# Patient Record
Sex: Male | Born: 1980 | Race: Black or African American | Hispanic: No | Marital: Single | Smoking: Current some day smoker
Health system: Southern US, Community
[De-identification: ages and names within clinical notes are randomized; demographics above are authoritative.]

## PROBLEM LIST (undated history)

## (undated) HISTORY — PX: OTHER SURGICAL HISTORY: SHX169

## (undated) HISTORY — PX: ABDOMINAL SURGERY: SHX537

---

## 2019-02-14 ENCOUNTER — Emergency Department (HOSPITAL_COMMUNITY)
Admission: EM | Admit: 2019-02-14 | Discharge: 2019-02-14 | Disposition: A | Discharge status: Home / Self Care | Payer: BLUE CROSS/BLUE SHIELD | Attending: Emergency Medicine | Admitting: Emergency Medicine

## 2019-02-14 ENCOUNTER — Other Ambulatory Visit: Payer: Self-pay

## 2019-02-14 ENCOUNTER — Emergency Department (HOSPITAL_COMMUNITY): Payer: BLUE CROSS/BLUE SHIELD

## 2019-02-14 ENCOUNTER — Encounter (HOSPITAL_COMMUNITY): Payer: Self-pay | Admitting: Emergency Medicine

## 2019-02-14 DIAGNOSIS — Y999 Unspecified external cause status: Secondary | ICD-10-CM | POA: Insufficient documentation

## 2019-02-14 DIAGNOSIS — S59901A Unspecified injury of right elbow, initial encounter: Secondary | ICD-10-CM | POA: Diagnosis present

## 2019-02-14 DIAGNOSIS — F1290 Cannabis use, unspecified, uncomplicated: Secondary | ICD-10-CM | POA: Insufficient documentation

## 2019-02-14 DIAGNOSIS — F17210 Nicotine dependence, cigarettes, uncomplicated: Secondary | ICD-10-CM | POA: Diagnosis not present

## 2019-02-14 DIAGNOSIS — Z20828 Contact with and (suspected) exposure to other viral communicable diseases: Secondary | ICD-10-CM | POA: Diagnosis not present

## 2019-02-14 DIAGNOSIS — X500XXA Overexertion from strenuous movement or load, initial encounter: Secondary | ICD-10-CM | POA: Insufficient documentation

## 2019-02-14 DIAGNOSIS — Y92831 Amusement park as the place of occurrence of the external cause: Secondary | ICD-10-CM | POA: Insufficient documentation

## 2019-02-14 DIAGNOSIS — S42451A Displaced fracture of lateral condyle of right humerus, initial encounter for closed fracture: Secondary | ICD-10-CM | POA: Diagnosis not present

## 2019-02-14 DIAGNOSIS — Y9389 Activity, other specified: Secondary | ICD-10-CM | POA: Insufficient documentation

## 2019-02-14 LAB — CBC
HCT: 41.5 % (ref 39.0–52.0)
Hemoglobin: 13.2 g/dL (ref 13.0–17.0)
MCH: 30.6 pg (ref 26.0–34.0)
MCHC: 31.8 g/dL (ref 30.0–36.0)
MCV: 96.1 fL (ref 80.0–100.0)
Platelets: 239 10*3/uL (ref 150–400)
RBC: 4.32 MIL/uL (ref 4.22–5.81)
RDW: 14.9 % (ref 11.5–15.5)
WBC: 10.3 10*3/uL (ref 4.0–10.5)
nRBC: 0 % (ref 0.0–0.2)

## 2019-02-14 LAB — BASIC METABOLIC PANEL
Anion gap: 15 (ref 5–15)
BUN: 9 mg/dL (ref 6–20)
CO2: 20 mmol/L — ABNORMAL LOW (ref 22–32)
Calcium: 9.3 mg/dL (ref 8.9–10.3)
Chloride: 102 mmol/L (ref 98–111)
Creatinine, Ser: 1 mg/dL (ref 0.61–1.24)
GFR calc Af Amer: >60 mL/min (ref >60)
GFR calc non Af Amer: >60 mL/min (ref >60)
Glucose, Bld: 75 mg/dL (ref 70–99)
Potassium: 3.9 mmol/L (ref 3.5–5.1)
Sodium: 137 mmol/L (ref 135–145)

## 2019-02-14 LAB — SARS CORONAVIRUS 2 BY RT PCR (HOSPITAL ORDER, PERFORMED IN ~~LOC~~ HOSPITAL LAB): SARS Coronavirus 2: NEGATIVE

## 2019-02-14 MED ORDER — OXYCODONE-ACETAMINOPHEN 5-325 MG PO TABS: 1.0000 | ORAL_TABLET | Freq: Four times a day (QID) | ORAL | 0 refills | Status: AC | PRN

## 2019-02-14 MED ORDER — MORPHINE SULFATE (PF) 4 MG/ML IV SOLN
4.0000 mg | Freq: Once | INTRAVENOUS | Status: AC
  Administered 2019-02-14: 4 mg via INTRAVENOUS
  Filled 2019-02-14: qty 1

## 2019-02-14 MED ORDER — OXYCODONE-ACETAMINOPHEN 5-325 MG PO TABS
1.0000 | ORAL_TABLET | ORAL | Status: DC | PRN
  Administered 2019-02-14: 1 via ORAL
  Filled 2019-02-14: qty 1

## 2019-02-14 NOTE — Discharge Instructions (Addendum)
1. Medications: Oxycodone, ibuprofen, usual home medications 2. Treatment: rest, ice, elevate; keep cast clean and dry, drink plenty of fluids, gentle stretching 3. Follow Up: Please followup with orthopedics as directed; Please return to the ER for worsening symptoms help with your cast or other concerns

## 2019-02-14 NOTE — ED Notes (Signed)
Pt refused blood draw.  Nurse notified 

## 2019-02-14 NOTE — ED Notes (Signed)
Patient transported to X-ray 

## 2019-02-14 NOTE — Progress Notes (Signed)
Orthopedic Tech Progress Note Patient Details:  Levi Lloyd [birth year] [id number]  Ortho Devices Type of Ortho Device: Post (long arm) splint Ortho Device/Splint Interventions: Application, Ordered   Post Interventions Patient Tolerated: Well Instructions Provided: Care of device, Adjustment of device   Melony Overly T 02/14/2019, 6:22 AM

## 2019-02-14 NOTE — ED Notes (Signed)
Ice pack given  Past humerus injury  Rt arm

## 2019-02-14 NOTE — ED Notes (Signed)
Ortho tech  Has been called to place a splint
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
## 2019-02-14 NOTE — ED Notes (Signed)
Pt is able to call his friends or family

## 2019-02-14 NOTE — ED Triage Notes (Signed)
Report he injured his R arm on a slip and slide. Pt has swelling to R elbow. Pt reports pain to R arm.

## 2019-02-14 NOTE — ED Notes (Addendum)
The pt is c/o pain and  Swelling to the  Elbow  The pt fell on a slip and slide 6 hours ago  C/o pain rt elbow and humerus

## 2020-09-19 IMAGING — DX RIGHT HUMERUS - 2+ VIEW
4 series · 4 of 4 positions shown · non-contrast
Comparison: None.

CLINICAL DATA: 38-year-old male with right upper extremity pain
after fall.

EXAM:
RIGHT HUMERUS - 2+ VIEW; RIGHT FOREARM - 2 VIEW

[humerus ap (1 of 2)]
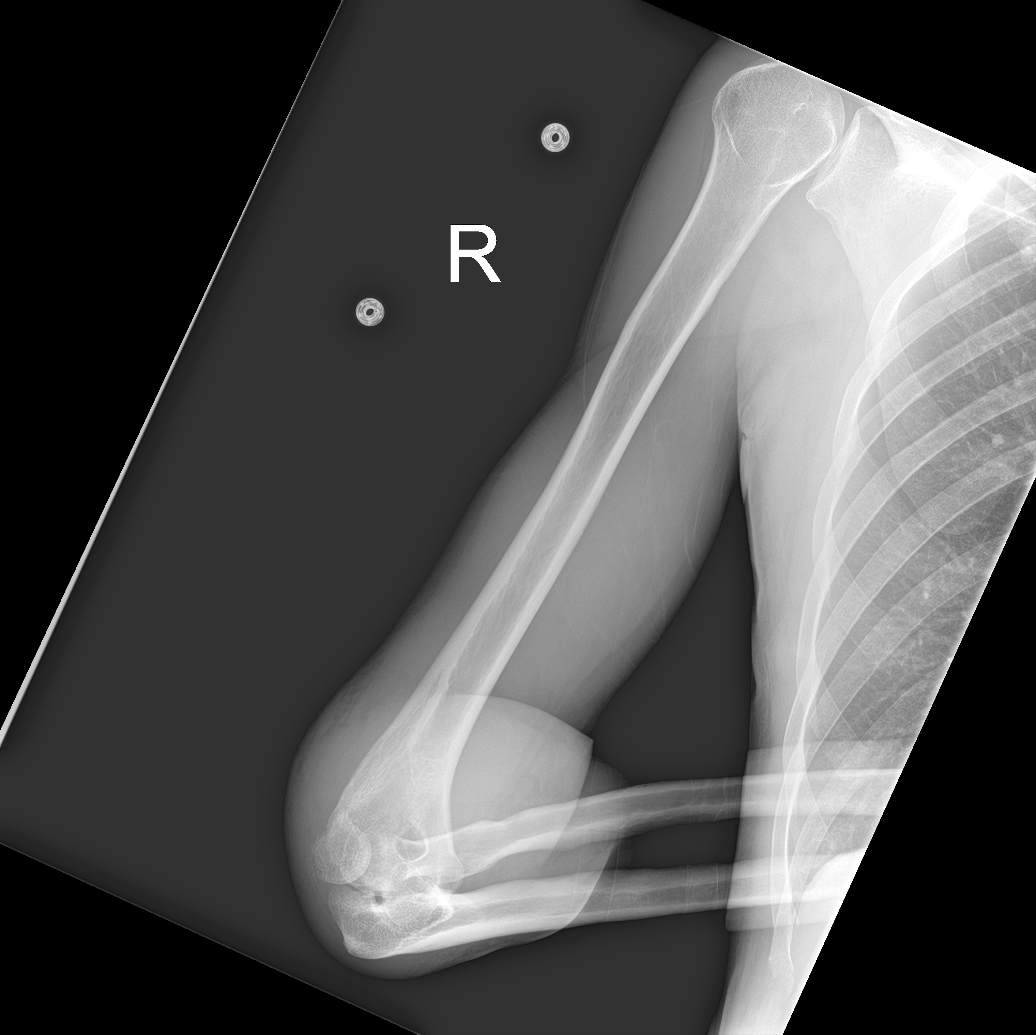

[humerus lat (1 of 2)]
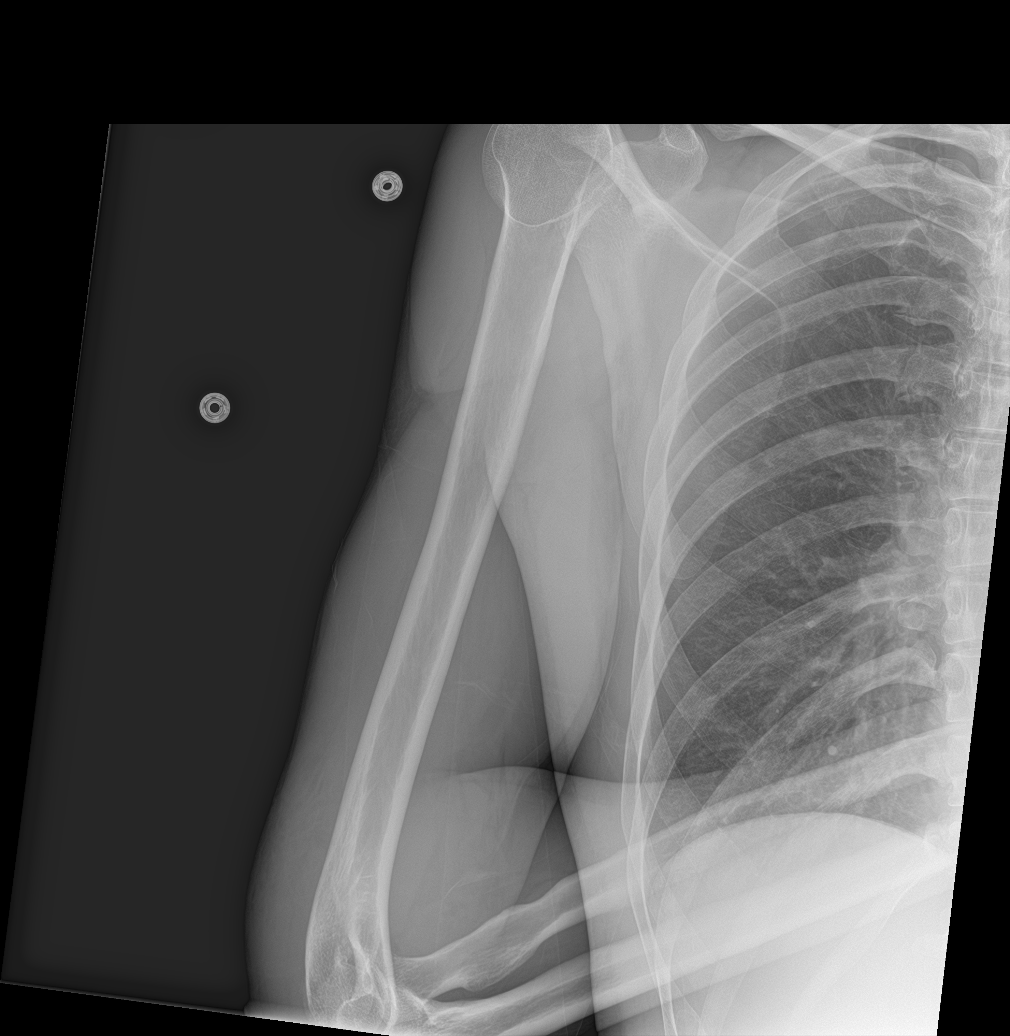

[humerus lat (2 of 2)]
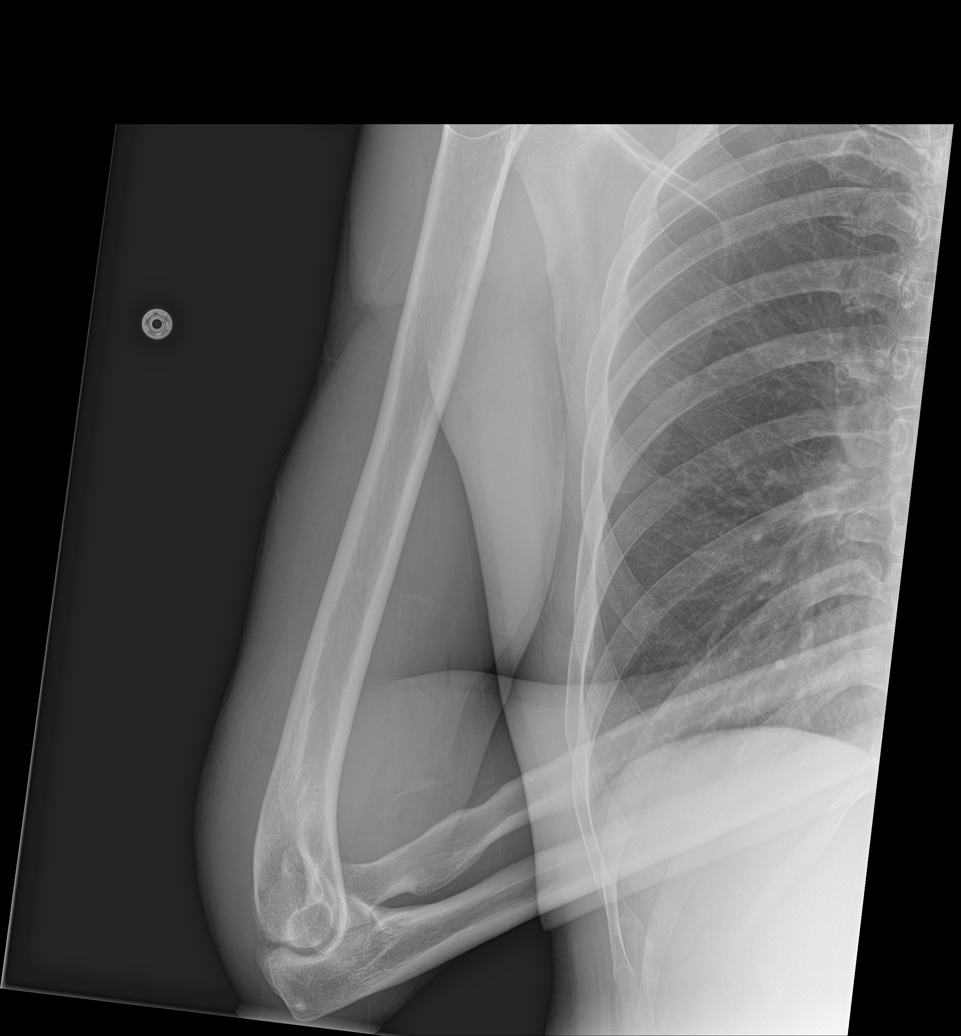

[humerus ap (2 of 2)]
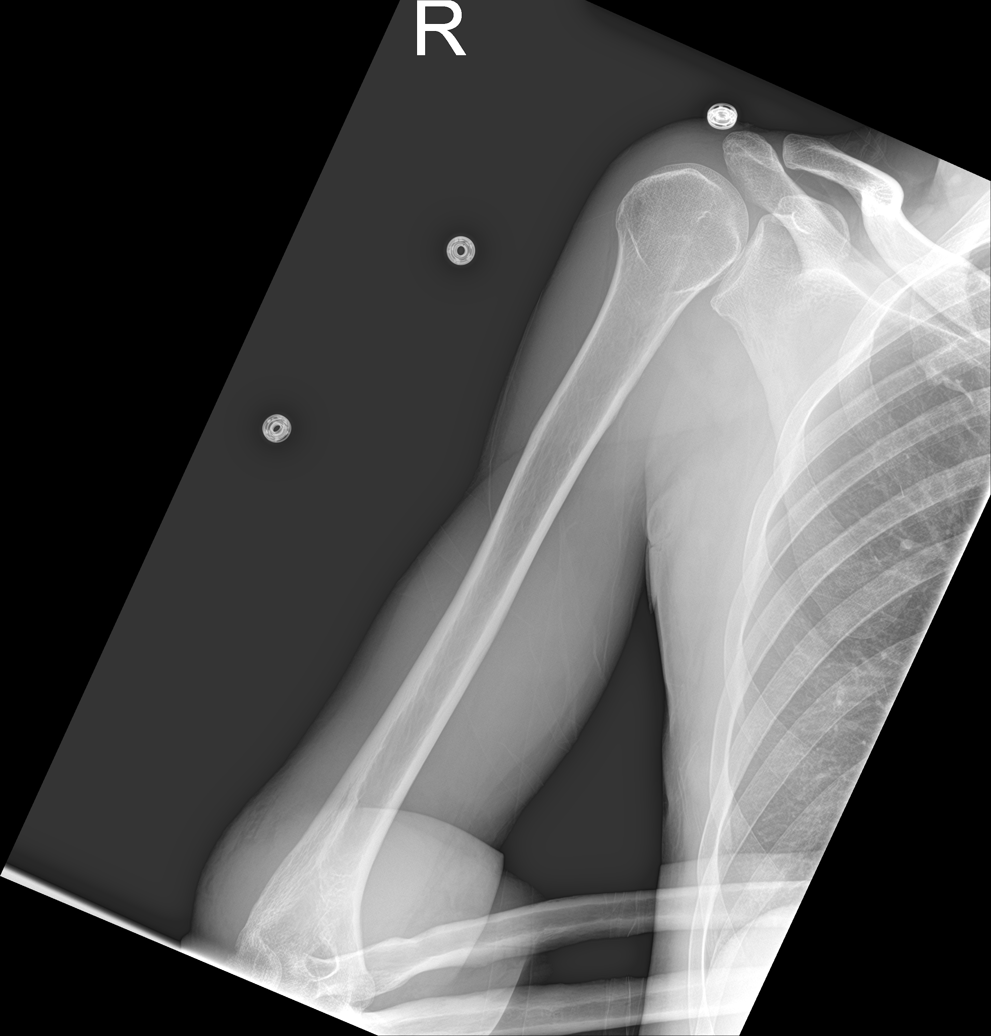

[4 of 4 positions shown; findings below may reference images not displayed]

FINDINGS: There is an intra-articular fracture of the distal humerus with
displaced fracture of the capitellum. Evaluation of the elbow is
limited due to suboptimal positioning. No definite other acute
fracture identified. The radial head appears to be in alignment with
a displaced capitellum and the ulna appears to be in alignment with
the trochlea. There is undulating thickening of the periosteum of
the radius and ulna which may represent melorheostosis or secondary
to prior inflammatory/infectious process. The bones are osteopenic
for the patient's age. There is significant soft tissue edema of the
elbow.
IMPRESSION: Displaced intra-articular fracture of the distal humerus. CT may
provide better evaluation.
